# Patient Record
Sex: Female | Born: 1990 | Race: White | Hispanic: No | Marital: Single | Smoking: Current every day smoker
Health system: Southern US, Community
[De-identification: ages and names within clinical notes are randomized; demographics above are authoritative.]

## PROBLEM LIST (undated history)

## (undated) DIAGNOSIS — F909 Attention-deficit hyperactivity disorder, unspecified type: Secondary | ICD-10-CM

## (undated) DIAGNOSIS — T744XXA Shaken infant syndrome, initial encounter: Secondary | ICD-10-CM

## (undated) HISTORY — PX: BRAIN SURGERY: SHX531

---
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
## 2010-08-22 ENCOUNTER — Emergency Department (HOSPITAL_COMMUNITY): Admission: EM | Admit: 2010-08-22 | Discharge: 2010-08-22 | Payer: Self-pay | Admitting: Emergency Medicine

## 2010-09-14 ENCOUNTER — Emergency Department (HOSPITAL_COMMUNITY): Admission: EM | Admit: 2010-09-14 | Discharge: 2010-09-14 | Payer: Self-pay | Admitting: Emergency Medicine

## 2010-09-14 ENCOUNTER — Emergency Department (HOSPITAL_COMMUNITY): Admission: EM | Admit: 2010-09-14 | Discharge: 2010-09-15 | Payer: Self-pay | Admitting: Emergency Medicine

## 2010-11-01 ENCOUNTER — Emergency Department (HOSPITAL_COMMUNITY): Admission: EM | Admit: 2010-11-01 | Discharge: 2010-08-20 | Payer: Self-pay | Admitting: Emergency Medicine

## 2011-02-06 LAB — COMPREHENSIVE METABOLIC PANEL
AST: 22 U/L (ref 0–37)
Alkaline Phosphatase: 64 U/L (ref 39–117)
BUN: 9 mg/dL (ref 6–23)
CO2: 24 mEq/L (ref 19–32)
Chloride: 103 mEq/L (ref 96–112)
Creatinine, Ser: 0.73 mg/dL (ref 0.4–1.2)
GFR calc non Af Amer: >60 mL/min (ref >60)
Potassium: 3.7 mEq/L (ref 3.5–5.1)
Total Bilirubin: 0.6 mg/dL (ref 0.3–1.2)

## 2011-02-06 LAB — HEMOGLOBIN AND HEMATOCRIT, BLOOD: HCT: 32.5 % — ABNORMAL LOW (ref 36.0–46.0)

## 2011-02-06 LAB — DIFFERENTIAL
Basophils Absolute: 0 10*3/uL (ref 0.0–0.1)
Basophils Relative: 0 % (ref 0–1)
Eosinophils Absolute: 0 10*3/uL (ref 0.0–0.7)
Neutro Abs: 14.5 10*3/uL — ABNORMAL HIGH (ref 1.7–7.7)
Neutrophils Relative %: 86 % — ABNORMAL HIGH (ref 43–77)

## 2011-02-06 LAB — WET PREP, GENITAL: Trich, Wet Prep: NONE SEEN

## 2011-02-06 LAB — POCT URINALYSIS DIPSTICK
Ketones, ur: 15 mg/dL — AB
Nitrite: NEGATIVE
Protein, ur: NEGATIVE mg/dL
Urobilinogen, UA: 0.2 mg/dL (ref 0.0–1.0)

## 2011-02-06 LAB — CBC
MCHC: 33.6 g/dL (ref 30.0–36.0)
Platelets: 284 10*3/uL (ref 150–400)
RDW: 12.8 % (ref 11.5–15.5)

## 2011-02-06 LAB — GC/CHLAMYDIA PROBE AMP, GENITAL
Chlamydia, DNA Probe: NEGATIVE
GC Probe Amp, Genital: NEGATIVE

## 2011-02-06 LAB — POCT PREGNANCY, URINE: Preg Test, Ur: NEGATIVE

## 2011-02-06 LAB — URINE CULTURE: Culture  Setup Time: 201110212254

## 2011-02-07 LAB — CBC
HCT: 41.2 % (ref 36.0–46.0)
MCHC: 34.5 g/dL (ref 30.0–36.0)
Platelets: 242 10*3/uL (ref 150–400)
RDW: 12.8 % (ref 11.5–15.5)
WBC: 12.6 10*3/uL — ABNORMAL HIGH (ref 4.0–10.5)

## 2011-02-07 LAB — POCT PREGNANCY, URINE: Preg Test, Ur: NEGATIVE

## 2011-02-07 LAB — POCT I-STAT, CHEM 8
BUN: 10 mg/dL (ref 6–23)
BUN: 13 mg/dL (ref 6–23)
Calcium, Ion: 1.02 mmol/L — ABNORMAL LOW (ref 1.12–1.32)
Calcium, Ion: 1.07 mmol/L — ABNORMAL LOW (ref 1.12–1.32)
Chloride: 107 mEq/L (ref 96–112)
Creatinine, Ser: 0.5 mg/dL (ref 0.4–1.2)
HCT: 45 % (ref 36.0–46.0)
Hemoglobin: 15 g/dL (ref 12.0–15.0)
Potassium: 2.8 mEq/L — ABNORMAL LOW (ref 3.5–5.1)
Sodium: 137 mEq/L (ref 135–145)
Sodium: 139 mEq/L (ref 135–145)
TCO2: 19 mmol/L (ref 0–100)

## 2011-02-07 LAB — HEPATIC FUNCTION PANEL
Albumin: 4.2 g/dL (ref 3.5–5.2)
Alkaline Phosphatase: 61 U/L (ref 39–117)
Bilirubin, Direct: 0.1 mg/dL (ref 0.0–0.3)
Indirect Bilirubin: 0.5 mg/dL (ref 0.3–0.9)
Total Bilirubin: 0.6 mg/dL (ref 0.3–1.2)

## 2011-02-07 LAB — DIFFERENTIAL
Basophils Absolute: 0 10*3/uL (ref 0.0–0.1)
Basophils Relative: 0 % (ref 0–1)
Lymphocytes Relative: 12 % (ref 12–46)
Monocytes Absolute: 1.1 10*3/uL — ABNORMAL HIGH (ref 0.1–1.0)
Neutro Abs: 10 10*3/uL — ABNORMAL HIGH (ref 1.7–7.7)
Neutrophils Relative %: 80 % — ABNORMAL HIGH (ref 43–77)

## 2011-03-07 LAB — URINE MICROSCOPIC-ADD ON

## 2011-03-07 LAB — URINALYSIS, ROUTINE W REFLEX MICROSCOPIC
Bilirubin Urine: NEGATIVE
Glucose, UA: NEGATIVE mg/dL
Hgb urine dipstick: NEGATIVE
Ketones, ur: NEGATIVE mg/dL
Nitrite: NEGATIVE
Protein, ur: NEGATIVE mg/dL
Specific Gravity, Urine: 1.016 (ref 1.005–1.030)
Urobilinogen, UA: 0.2 mg/dL (ref 0.0–1.0)
pH: 6.5 (ref 5.0–8.0)

## 2011-03-07 LAB — PREGNANCY, URINE: Preg Test, Ur: NEGATIVE

## 2012-04-18 IMAGING — CT CT HEAD W/O CM
2 series · 16 of 30 positions shown, 20 images · non-contrast
Comparison: 02/04/2009.

CLINICAL DATA: Migraine headache.  Nausea and vomiting.

CT HEAD WITHOUT CONTRAST
TECHNIQUE: Contiguous axial images were obtained from the base of
the skull through the vertex without contrast.

[Series 3: head w/o · axial · non-contrast · 0.50mm/px · z∈[+8,+138]mm · 13 of 32 slices shown, 17 images]
[im 3/32  brain]
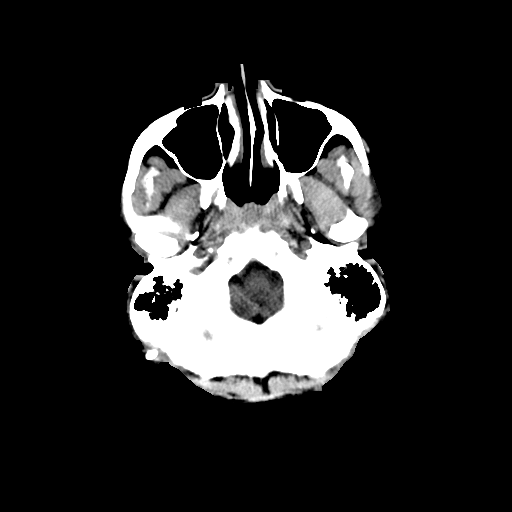
[im 3/32  bone]
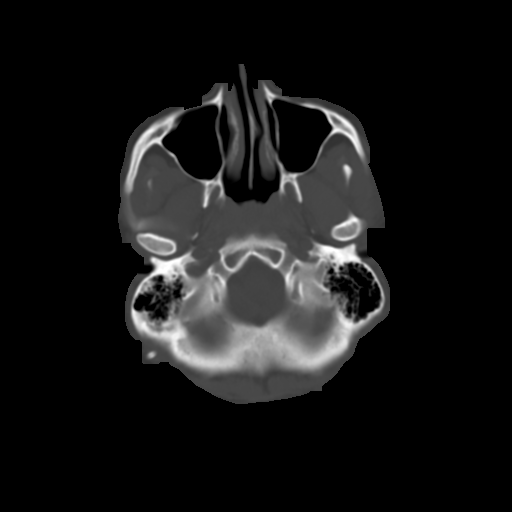
[im 5/32  brain]
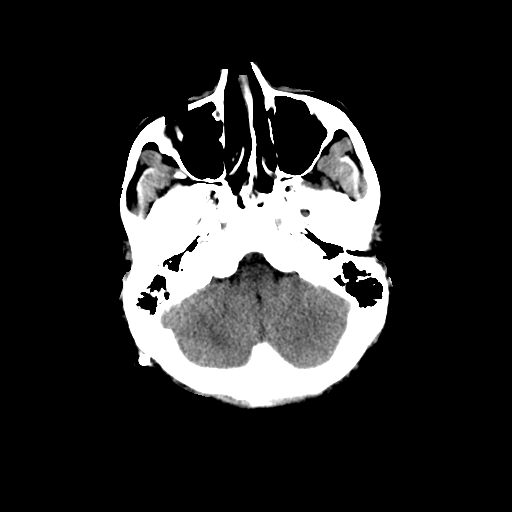
[im 7/32  brain]
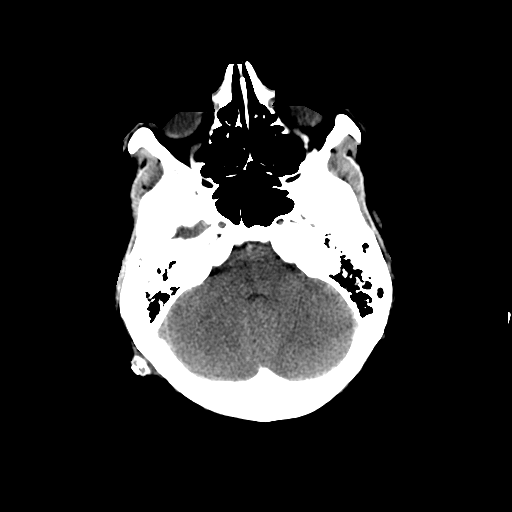
[im 9/32  brain]
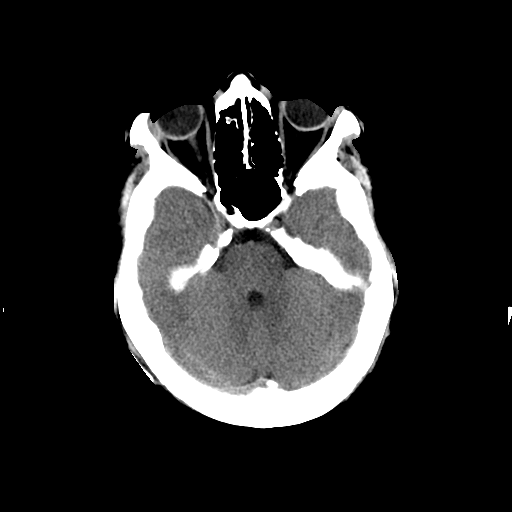
[im 12/32  brain]
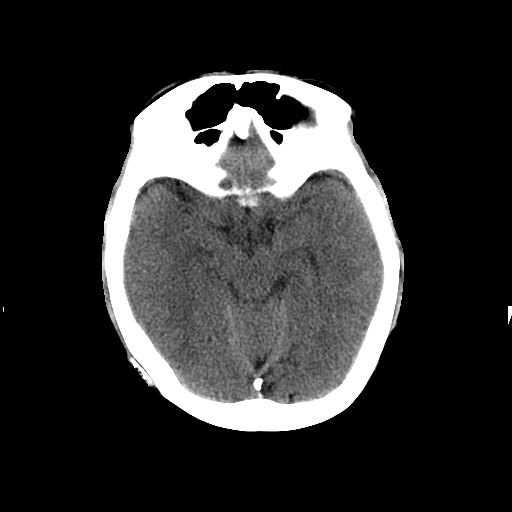
[im 12/32  bone]
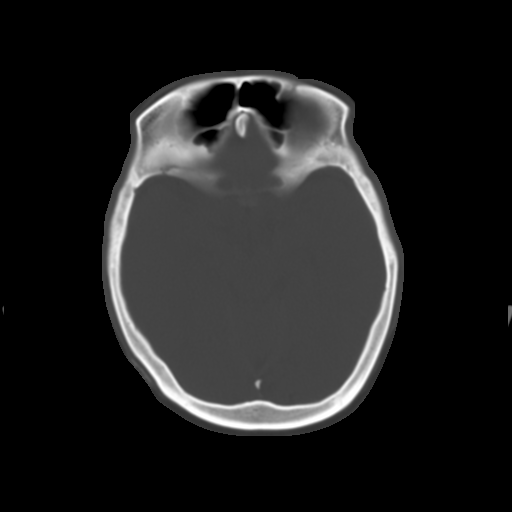
[im 14/32  brain]
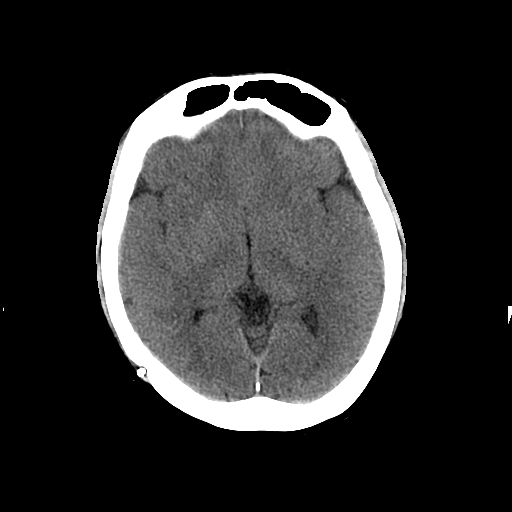
[im 16/32  brain]
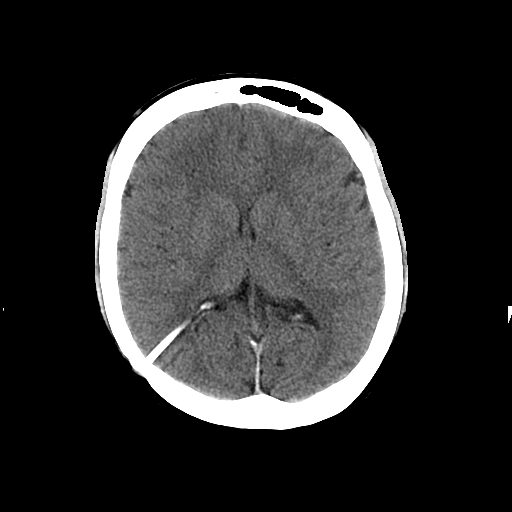
[im 18/32  brain]
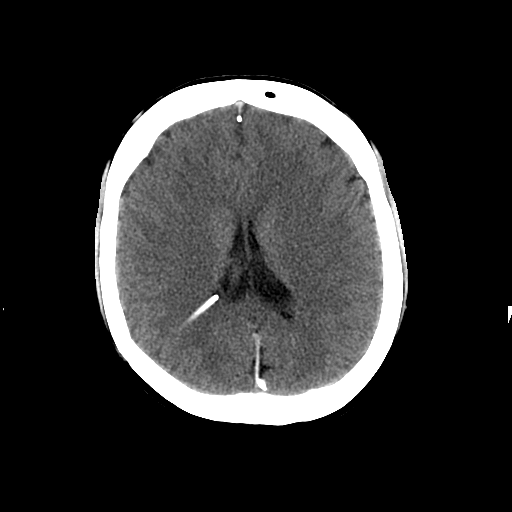
[im 20/32  brain]
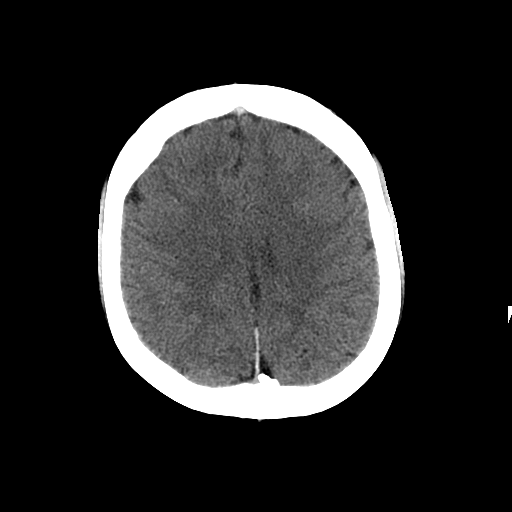
[im 20/32  bone]
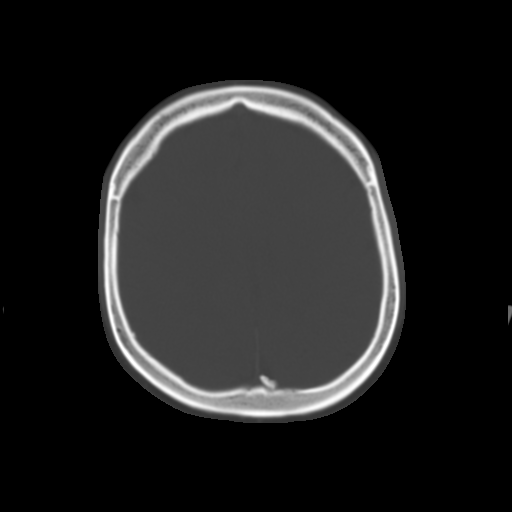
[im 23/32  brain]
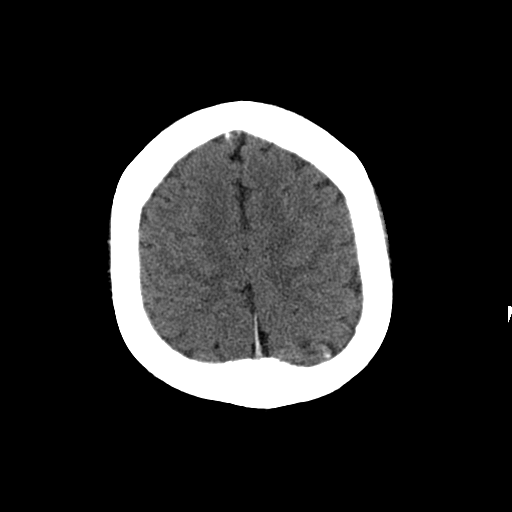
[im 25/32  brain]
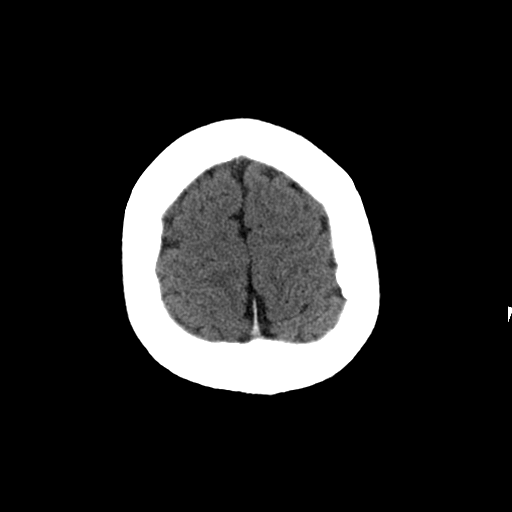
[im 27/32  brain]
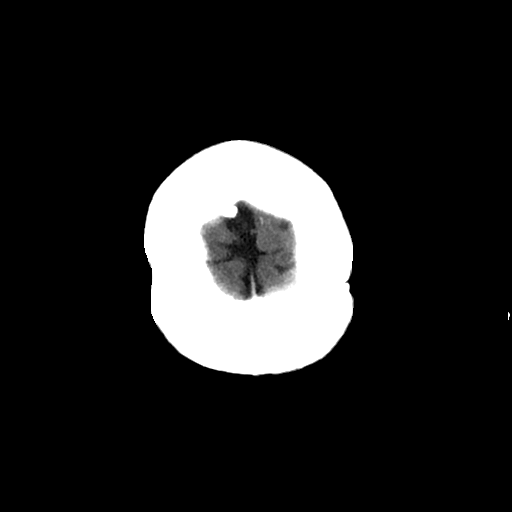
[im 29/32  brain]
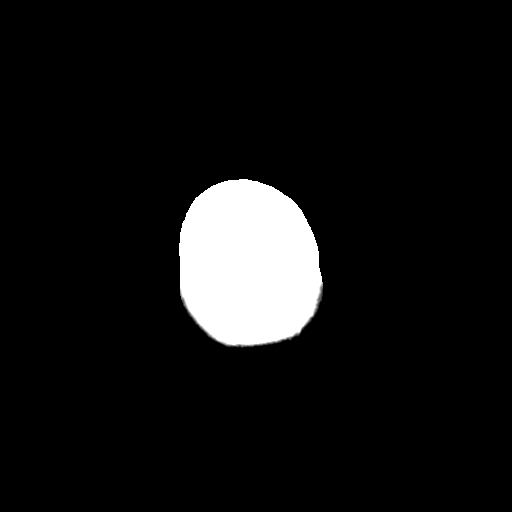
[im 29/32  bone]
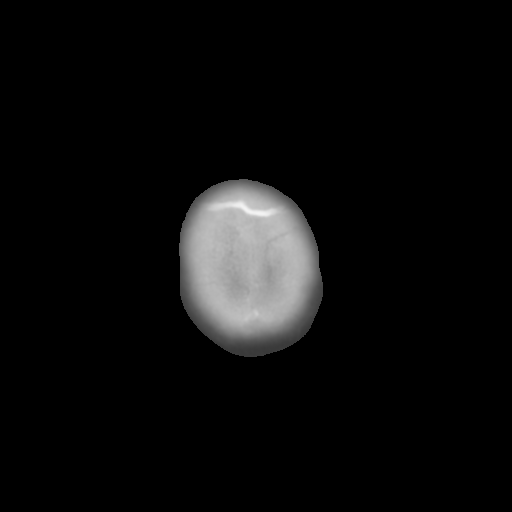

[Series 4: head w/o bone · axial · non-contrast · 0.50mm/px · z∈[+8,+53]mm · 3 of 32 slices shown]
[im 3/32  bone]
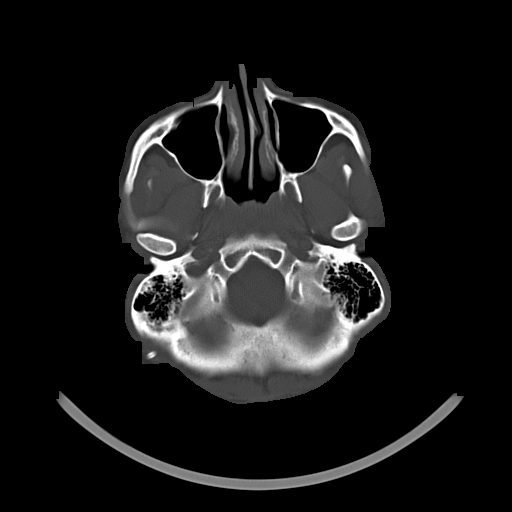
[im 7/32  bone]
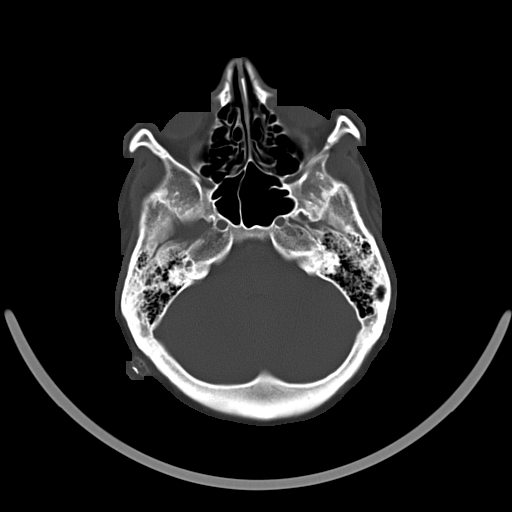
[im 12/32  bone]
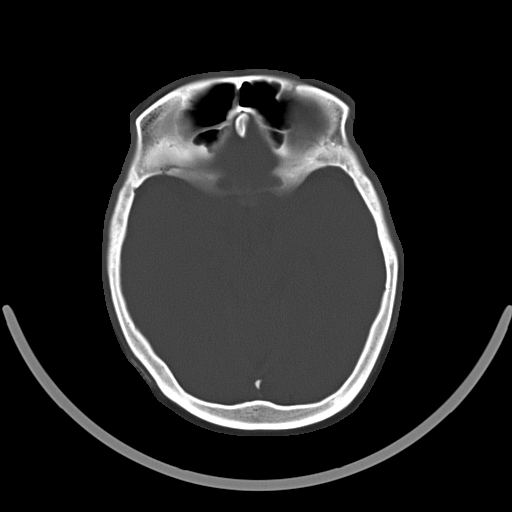

[16 of 30 positions shown; findings below may reference images not displayed]

FINDINGS: No mass lesion, mass effect, midline shift,
hydrocephalus, hemorrhage.  No territorial ischemia or acute
infarction. Burr hole is present in the right parietal scalp.
There is also a right parietal ventriculostomy terminating in the
atrium of the right lateral ventricle.  Paranasal sinuses appear
within normal limits.  Ventricular size and configuration is
unchanged compared to prior exam of 2747.
IMPRESSION: 1.  No acute abnormality.
2.  Right parietal shunt appears unchanged.  No hydrocephalus or
change in size/configuration of the ventricles.
3.  Stable head CT.

## 2014-01-03 ENCOUNTER — Encounter (HOSPITAL_COMMUNITY): Payer: Self-pay | Admitting: Emergency Medicine

## 2014-01-03 ENCOUNTER — Emergency Department (HOSPITAL_COMMUNITY)
Admission: EM | Admit: 2014-01-03 | Discharge: 2014-01-03 | Disposition: A | Discharge status: Home / Self Care | Payer: Self-pay | Attending: Emergency Medicine | Admitting: Emergency Medicine

## 2014-01-03 DIAGNOSIS — F17200 Nicotine dependence, unspecified, uncomplicated: Secondary | ICD-10-CM | POA: Insufficient documentation

## 2014-01-03 DIAGNOSIS — N390 Urinary tract infection, site not specified: Secondary | ICD-10-CM | POA: Insufficient documentation

## 2014-01-03 DIAGNOSIS — Z79899 Other long term (current) drug therapy: Secondary | ICD-10-CM | POA: Insufficient documentation

## 2014-01-03 DIAGNOSIS — F909 Attention-deficit hyperactivity disorder, unspecified type: Secondary | ICD-10-CM | POA: Insufficient documentation

## 2014-01-03 DIAGNOSIS — Z3202 Encounter for pregnancy test, result negative: Secondary | ICD-10-CM | POA: Insufficient documentation

## 2014-01-03 HISTORY — DX: Shaken infant syndrome, initial encounter: T74.4XXA

## 2014-01-03 HISTORY — DX: Attention-deficit hyperactivity disorder, unspecified type: F90.9

## 2014-01-03 LAB — URINALYSIS, ROUTINE W REFLEX MICROSCOPIC
Bilirubin Urine: NEGATIVE
Glucose, UA: NEGATIVE mg/dL
LEUKOCYTES UA: NEGATIVE
NITRITE: NEGATIVE
PH: 6 (ref 5.0–8.0)
Protein, ur: NEGATIVE mg/dL
Specific Gravity, Urine: 1.015 (ref 1.005–1.030)
UROBILINOGEN UA: 0.2 mg/dL (ref 0.0–1.0)

## 2014-01-03 LAB — URINE MICROSCOPIC-ADD ON

## 2014-01-03 LAB — POCT PREGNANCY, URINE: Preg Test, Ur: NEGATIVE

## 2014-01-03 MED ORDER — HYDROCODONE-ACETAMINOPHEN 5-325 MG PO TABS: ORAL_TABLET | ORAL | Status: AC

## 2014-01-03 MED ORDER — CEPHALEXIN 500 MG PO CAPS: 500.0000 mg | ORAL_CAPSULE | Freq: Four times a day (QID) | ORAL | Status: AC

## 2014-01-03 MED ORDER — CEPHALEXIN 500 MG PO CAPS
500.0000 mg | ORAL_CAPSULE | Freq: Once | ORAL | Status: AC
  Administered 2014-01-03: 500 mg via ORAL
  Filled 2014-01-03: qty 1

## 2014-01-03 MED ORDER — HYDROCODONE-ACETAMINOPHEN 5-325 MG PO TABS
1.0000 | ORAL_TABLET | Freq: Once | ORAL | Status: AC
  Administered 2014-01-03: 1 via ORAL
  Filled 2014-01-03: qty 1

## 2014-01-03 NOTE — Discharge Instructions (Signed)
Urinary Tract Infection °A urinary tract infection (UTI) can occur any place along the urinary tract. The tract includes the kidneys, ureters, bladder, and urethra. A type of germ called bacteria often causes a UTI. UTIs are often helped with antibiotic medicine.  °HOME CARE  °· If given, take antibiotics as told by your doctor. Finish them even if you start to feel better. °· Drink enough fluids to keep your pee (urine) clear or pale yellow. °· Avoid tea, drinks with caffeine, and bubbly (carbonated) drinks. °· Pee often. Avoid holding your pee in for a long time. °· Pee before and after having sex (intercourse). °· Wipe from front to back after you poop (bowel movement) if you are a woman. Use each tissue only once. °GET HELP RIGHT AWAY IF:  °· You have back pain. °· You have lower belly (abdominal) pain. °· You have chills. °· You feel sick to your stomach (nauseous). °· You throw up (vomit). °· Your burning or discomfort with peeing does not go away. °· You have a fever. °· Your symptoms are not better in 3 days. °MAKE SURE YOU:  °· Understand these instructions. °· Will watch your condition. °· Will get help right away if you are not doing well or get worse. °Document Released: 04/29/2008 Document Revised: 08/05/2012 Document Reviewed: 06/11/2012 °[employer]® Patient Information ©2014 [employer]. ° °

## 2014-01-03 NOTE — ED Notes (Addendum)
Alert, pain in back and dysuria , frequency of urination and voiding sm amts.    [place] and began having low backpain and UTI sx.  No NVD. No fever or
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
## 2014-01-03 NOTE — ED Notes (Signed)
Patient c/o lower back pain with burning during urination that started last night. Per patient when she urinates she gets "sharp pains in back."

## 2014-01-04 LAB — URINE CULTURE
COLONY COUNT: NO GROWTH
CULTURE: NO GROWTH

## 2014-01-05 NOTE — ED Provider Notes (Signed)
Medical screening examination/treatment/procedure(s) were performed by non-physician practitioner and as supervising physician I was immediately available for consultation/collaboration.  EKG Interpretation   None         Vallory Oetken M Anndrea Mihelich, DO 01/05/14 0815 

## 2014-09-26 ENCOUNTER — Encounter (HOSPITAL_COMMUNITY): Payer: Self-pay | Admitting: Emergency Medicine

## 2017-10-28 ENCOUNTER — Emergency Department (HOSPITAL_COMMUNITY): Payer: Self-pay

## 2017-10-28 ENCOUNTER — Emergency Department (HOSPITAL_COMMUNITY)
Admission: EM | Admit: 2017-10-28 | Discharge: 2017-10-29 | Disposition: A | Discharge status: Home / Self Care | Payer: Self-pay | Attending: Emergency Medicine | Admitting: Emergency Medicine

## 2017-10-28 DIAGNOSIS — F17210 Nicotine dependence, cigarettes, uncomplicated: Secondary | ICD-10-CM | POA: Insufficient documentation

## 2017-10-28 DIAGNOSIS — F1910 Other psychoactive substance abuse, uncomplicated: Secondary | ICD-10-CM

## 2017-10-28 DIAGNOSIS — T50901A Poisoning by unspecified drugs, medicaments and biological substances, accidental (unintentional), initial encounter: Secondary | ICD-10-CM

## 2017-10-28 DIAGNOSIS — Z79899 Other long term (current) drug therapy: Secondary | ICD-10-CM | POA: Insufficient documentation

## 2017-10-28 DIAGNOSIS — T401X1A Poisoning by heroin, accidental (unintentional), initial encounter: Secondary | ICD-10-CM | POA: Insufficient documentation

## 2017-10-28 DIAGNOSIS — F1410 Cocaine abuse, uncomplicated: Secondary | ICD-10-CM | POA: Insufficient documentation

## 2017-10-28 LAB — SALICYLATE LEVEL

## 2017-10-28 LAB — CK: CK TOTAL: 122 U/L (ref 38–234)

## 2017-10-28 LAB — RAPID URINE DRUG SCREEN, HOSP PERFORMED
Amphetamines: NOT DETECTED
BARBITURATES: NOT DETECTED
Benzodiazepines: POSITIVE — AB
COCAINE: POSITIVE — AB
Opiates: POSITIVE — AB
Tetrahydrocannabinol: NOT DETECTED

## 2017-10-28 LAB — CBC WITH DIFFERENTIAL/PLATELET
Basophils Absolute: 0 10*3/uL (ref 0.0–0.1)
Basophils Relative: 0 %
Eosinophils Absolute: 0.1 10*3/uL (ref 0.0–0.7)
Eosinophils Relative: 1 %
HEMATOCRIT: 36.9 % (ref 36.0–46.0)
Hemoglobin: 12.2 g/dL (ref 12.0–15.0)
LYMPHS ABS: 2.4 10*3/uL (ref 0.7–4.0)
LYMPHS PCT: 33 %
MCH: 29.5 pg (ref 26.0–34.0)
MCHC: 33.1 g/dL (ref 30.0–36.0)
MCV: 89.3 fL (ref 78.0–100.0)
MONOS PCT: 7 %
Monocytes Absolute: 0.5 10*3/uL (ref 0.1–1.0)
NEUTROS ABS: 4.3 10*3/uL (ref 1.7–7.7)
Neutrophils Relative %: 59 %
Platelets: 241 10*3/uL (ref 150–400)
RBC: 4.13 MIL/uL (ref 3.87–5.11)
RDW: 12.7 % (ref 11.5–15.5)
WBC: 7.3 10*3/uL (ref 4.0–10.5)

## 2017-10-28 LAB — COMPREHENSIVE METABOLIC PANEL
ALT: 18 U/L (ref 14–54)
AST: 26 U/L (ref 15–41)
Albumin: 3.6 g/dL (ref 3.5–5.0)
Alkaline Phosphatase: 49 U/L (ref 38–126)
Anion gap: 7 (ref 5–15)
BUN: 12 mg/dL (ref 6–20)
CO2: 28 mmol/L (ref 22–32)
Calcium: 8.8 mg/dL — ABNORMAL LOW (ref 8.9–10.3)
Chloride: 105 mmol/L (ref 101–111)
Creatinine, Ser: 0.67 mg/dL (ref 0.44–1.00)
GFR calc Af Amer: >60 mL/min (ref >60)
GFR calc non Af Amer: >60 mL/min (ref >60)
Glucose, Bld: 80 mg/dL (ref 65–99)
POTASSIUM: 3.4 mmol/L — AB (ref 3.5–5.1)
SODIUM: 140 mmol/L (ref 135–145)
Total Bilirubin: 0.2 mg/dL — ABNORMAL LOW (ref 0.3–1.2)
Total Protein: 6.5 g/dL (ref 6.5–8.1)

## 2017-10-28 LAB — ACETAMINOPHEN LEVEL: Acetaminophen (Tylenol), Serum: <10 ug/mL — ABNORMAL LOW (ref 10–30)

## 2017-10-28 LAB — I-STAT BETA HCG BLOOD, ED (MC, WL, AP ONLY): I-stat hCG, quantitative: <5 m[IU]/mL (ref <5)

## 2017-10-28 LAB — CBG MONITORING, ED: Glucose-Capillary: 65 mg/dL (ref 65–99)

## 2017-10-28 LAB — ETHANOL: Alcohol, Ethyl (B): <10 mg/dL (ref <10)

## 2017-10-28 MED ORDER — NALOXONE HCL 2 MG/2ML IJ SOSY
PREFILLED_SYRINGE | INTRAMUSCULAR | Status: AC
  Filled 2017-10-28: qty 2

## 2017-10-28 MED ORDER — POTASSIUM CHLORIDE CRYS ER 20 MEQ PO TBCR
40.0000 meq | EXTENDED_RELEASE_TABLET | Freq: Once | ORAL | Status: AC
  Administered 2017-10-28: 40 meq via ORAL
  Filled 2017-10-28: qty 2
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
## 2017-10-28 NOTE — ED Notes (Signed)
Pt used the bedside commode without incident.

## 2017-10-28 NOTE — ED Triage Notes (Signed)
Per EMS pt took possibly Heroin and Marsh DollyMarianna about 1 hr ago

## 2017-10-29 NOTE — Discharge Instructions (Signed)
Call any of the numbers on the resource guides to get help with your drug problem

## 2017-10-29 NOTE — ED Notes (Signed)
Pt has no family contact number on file. Pt to stay until morning for Obs, hopefully mother or other will call pt will more than likely need a ride home.

## 2017-10-29 NOTE — ED Notes (Signed)
Pt walked around nurses station without incident.  Pt called grandmother to call mother to come get her.

## 2017-10-29 NOTE — ED Provider Notes (Addendum)
03:00 AM pt left at change of shift.  The patient overdosed tonight on heroin with a very positive UDS.  Nursing staff was unable to contact her mother who the patient lives with.  Her mother did not leave a contact phone number and the patient does not know her mother's phone number.  PT is sleeping, easily awakened, states she accidentally overdosed. Falls back to sleep quickly.   6:30 AM patient sleeping however she is very easily awakened.  She seems much more awake than earlier.  She states she feels fine except she still feels sleepy.  I am going to have nursing staff get her up and ambulate and if she wants to eat or drink she can.  She will do need to work on getting a way back home.  Nurses report patient ambulated well without difficulty.  She is trying to find a ride home.  Devoria AlbeIva Aquarius Latouche, MD, Concha PyoFACEP      Hakiem Malizia, MD 10/29/17 [phone]    Devoria AlbeKnapp, Karma Hiney, MD 10/29/17 [phone]

## 2021-08-23 ENCOUNTER — Emergency Department (HOSPITAL_COMMUNITY): Payer: Self-pay

## 2021-08-23 ENCOUNTER — Encounter (HOSPITAL_COMMUNITY): Payer: Self-pay | Admitting: Emergency Medicine

## 2021-08-23 ENCOUNTER — Emergency Department (HOSPITAL_COMMUNITY)
Admission: EM | Admit: 2021-08-23 | Discharge: 2021-08-24 | Disposition: A | Discharge status: Home / Self Care | Payer: Self-pay | Attending: Student | Admitting: Student

## 2021-08-23 DIAGNOSIS — M549 Dorsalgia, unspecified: Secondary | ICD-10-CM

## 2021-08-23 DIAGNOSIS — F17210 Nicotine dependence, cigarettes, uncomplicated: Secondary | ICD-10-CM | POA: Insufficient documentation

## 2021-08-23 DIAGNOSIS — N7091 Salpingitis, unspecified: Secondary | ICD-10-CM | POA: Insufficient documentation

## 2021-08-23 DIAGNOSIS — M545 Low back pain, unspecified: Secondary | ICD-10-CM | POA: Insufficient documentation

## 2021-08-23 DIAGNOSIS — N7093 Salpingitis and oophoritis, unspecified: Secondary | ICD-10-CM

## 2021-08-23 LAB — CBC WITH DIFFERENTIAL/PLATELET
Abs Immature Granulocytes: 0.06 10*3/uL (ref 0.00–0.07)
Basophils Absolute: 0 10*3/uL (ref 0.0–0.1)
Basophils Relative: 0 %
Eosinophils Absolute: 0 10*3/uL (ref 0.0–0.5)
Eosinophils Relative: 0 %
HCT: 34.7 % — ABNORMAL LOW (ref 36.0–46.0)
Hemoglobin: 10.8 g/dL — ABNORMAL LOW (ref 12.0–15.0)
Immature Granulocytes: 1 %
Lymphocytes Relative: 13 %
Lymphs Abs: 1.4 10*3/uL (ref 0.7–4.0)
MCH: 25.8 pg — ABNORMAL LOW (ref 26.0–34.0)
MCHC: 31.1 g/dL (ref 30.0–36.0)
MCV: 83 fL (ref 80.0–100.0)
Monocytes Absolute: 0.7 10*3/uL (ref 0.1–1.0)
Monocytes Relative: 6 %
Neutro Abs: 8.5 10*3/uL — ABNORMAL HIGH (ref 1.7–7.7)
Neutrophils Relative %: 80 %
Platelets: 246 10*3/uL (ref 150–400)
RBC: 4.18 MIL/uL (ref 3.87–5.11)
RDW: 15.9 % — ABNORMAL HIGH (ref 11.5–15.5)
WBC: 10.7 10*3/uL — ABNORMAL HIGH (ref 4.0–10.5)
nRBC: 0 % (ref 0.0–0.2)

## 2021-08-23 LAB — URINALYSIS, ROUTINE W REFLEX MICROSCOPIC
Bilirubin Urine: NEGATIVE
Glucose, UA: NEGATIVE mg/dL
Ketones, ur: NEGATIVE mg/dL
Nitrite: POSITIVE — AB
Protein, ur: 100 mg/dL — AB
Specific Gravity, Urine: 1.025 (ref 1.005–1.030)
WBC, UA: >50 WBC/hpf — ABNORMAL HIGH (ref 0–5)
pH: 5 (ref 5.0–8.0)

## 2021-08-23 LAB — BASIC METABOLIC PANEL
Anion gap: 8 (ref 5–15)
BUN: 11 mg/dL (ref 6–20)
CO2: 25 mmol/L (ref 22–32)
Calcium: 8.4 mg/dL — ABNORMAL LOW (ref 8.9–10.3)
Chloride: 104 mmol/L (ref 98–111)
Creatinine, Ser: 0.56 mg/dL (ref 0.44–1.00)
GFR, Estimated: >60 mL/min (ref >60)
Glucose, Bld: 91 mg/dL (ref 70–99)
Potassium: 3.7 mmol/L (ref 3.5–5.1)
Sodium: 137 mmol/L (ref 135–145)

## 2021-08-23 LAB — PREGNANCY, URINE: Preg Test, Ur: NEGATIVE

## 2021-08-23 MED ORDER — IOHEXOL 300 MG/ML  SOLN
100.0000 mL | Freq: Once | INTRAMUSCULAR | Status: AC | PRN
  Administered 2021-08-23: 100 mL via INTRAVENOUS

## 2021-08-23 MED ORDER — HYDROMORPHONE HCL 1 MG/ML IJ SOLN
1.0000 mg | Freq: Once | INTRAMUSCULAR | Status: AC
  Administered 2021-08-23: 1 mg via INTRAMUSCULAR
  Filled 2021-08-23: qty 1

## 2021-08-23 MED ORDER — FENTANYL CITRATE PF 50 MCG/ML IJ SOSY
50.0000 ug | PREFILLED_SYRINGE | Freq: Once | INTRAMUSCULAR | Status: AC
  Administered 2021-08-23: 50 ug via INTRAMUSCULAR
  Filled 2021-08-23: qty 1

## 2021-08-23 NOTE — ED Notes (Signed)
Pt in CT, IV infiltrated, new IV placed by this RN
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
## 2021-08-23 NOTE — ED Triage Notes (Signed)
Pt from home via [facility]. Pt reports right sided back pain that starts at the buttock and radiates down behind her right knee. Pt reports this started 2 days after picking up a stove.

## 2021-08-23 NOTE — ED Notes (Signed)
Pt here with reports of right back pain x 2days, possibly relating to lifting heavy appliance. Pt is crying in pain, and writhing.

## 2021-08-23 NOTE — ED Notes (Signed)
Pt was sleeping, pt voided while sleeping in her adult diaper. Pt reports improvement in pain but still asking for medicine to help her rest.

## 2021-08-24 MED ORDER — CEFADROXIL 500 MG PO CAPS: 500.0000 mg | ORAL_CAPSULE | Freq: Two times a day (BID) | ORAL | 0 refills | Status: AC

## 2021-08-24 MED ORDER — CEFTRIAXONE SODIUM 1 G IJ SOLR: 1.0000 g | Freq: Once | INTRAMUSCULAR | Status: DC

## 2021-08-24 MED ORDER — SODIUM CHLORIDE 0.9 % IV SOLN
1.0000 g | Freq: Once | INTRAVENOUS | Status: AC
  Administered 2021-08-24: 1 g via INTRAVENOUS
  Filled 2021-08-24: qty 10

## 2021-08-24 MED ORDER — DOXYCYCLINE HYCLATE 100 MG PO CAPS: 100.0000 mg | ORAL_CAPSULE | Freq: Two times a day (BID) | ORAL | 0 refills | Status: AC

## 2021-08-24 MED ORDER — OXYCODONE-ACETAMINOPHEN 5-325 MG PO TABS: 1.0000 | ORAL_TABLET | Freq: Three times a day (TID) | ORAL | 0 refills | Status: AC | PRN

## 2021-08-24 MED ORDER — CYCLOBENZAPRINE HCL 10 MG PO TABS: 10.0000 mg | ORAL_TABLET | Freq: Two times a day (BID) | ORAL | 0 refills | Status: AC | PRN

## 2021-08-24 MED ORDER — HYDROMORPHONE HCL 1 MG/ML IJ SOLN
0.2500 mg | Freq: Once | INTRAMUSCULAR | Status: AC
Start: 2021-08-24 — End: 2021-08-24
  Administered 2021-08-24: 0.25 mg via INTRAVENOUS
  Filled 2021-08-24: qty 1

## 2021-08-24 NOTE — Discharge Instructions (Addendum)
Back pain-likely this is a muscular strain, recommend over-the-counter pain medications,  heating the area,  stretching out this help relieve your pain.  I was given you a short course of narcotics please take as prescribed.I have given you a short course of narcotics please take as prescribed.  This medication can make you drowsy do not consume alcohol or operate heavy machinery when taking this medication.  This medication is Tylenol in it do not take Tylenol and take this medication.  I have also given you a prescription for a muscle relaxer this can make you drowsy do not consume alcohol or operate heavy machinery when taking this medication.    Pelvic infection-I have started you on antibiotics doxycycline as well as Duricef please take them as prescribed.  It is extremely important that you follow-up with OB/GYN in one week for reevaluation you [facility] [place] or go to the women's center in [place].  Come back to the emergency department if you develop chest pain, shortness of breath, severe abdominal pain, uncontrolled nausea, vomiting, diarrhea.

## 2021-08-26 LAB — URINE CULTURE: Culture: >=100000 — AB

## 2021-08-27 ENCOUNTER — Telehealth: Payer: Self-pay

## 2021-08-27 LAB — GC/CHLAMYDIA PROBE AMP (~~LOC~~) NOT AT ARMC
Chlamydia: NEGATIVE
Comment: NEGATIVE
Comment: NORMAL
Neisseria Gonorrhea: NEGATIVE

## 2021-08-27 NOTE — Telephone Encounter (Signed)
Post ED Visit - Positive Culture Follow-up  Culture report reviewed by antimicrobial stewardship pharmacist: [facility] [x]  , Pharm.D. []  Loleta Dicker, Pharm.D., BCPS AQ-ID []  , Pharm.D., BCPS []  Celedonio Miyamoto, Pharm.D., BCPS []  [place], Garvin Fila.D., BCPS, AAHIVP []  , Pharm.D., BCPS, AAHIVP []  Georgina Pillion, PharmD, BCPS []  , PharmD, BCPS []  [place], PharmD, BCPS []  [address], PharmD []  , PharmD, BCPS []  Estella Husk, PharmD  Pharmacy Team []  Lysle Pearl, PharmD []  , PharmD []  Phillips Climes, PharmD []  , Rph []  Agapito Games) , PharmD []  Verlan Friends, PharmD []  , PharmD []  Mervyn Gay, PharmD []  , PharmD []  Vinnie Level, PharmD []  [facility], PharmD []  , PharmD []  Len Childs, PharmD   Positive urine culture Treated with Cefadroxil and Doxycycline Hyclate, organism sensitive to the same and no further patient follow-up is required at this time.  08/27/2021, 11:37 AM

## 2022-10-30 ENCOUNTER — Ambulatory Visit: Payer: Self-pay | Admitting: *Deleted

## 2022-10-30 NOTE — Telephone Encounter (Signed)
Reason for Disposition  [1] Stool is mucus or pus AND [2] persists > 24 hours  Answer Assessment - Initial Assessment Questions 1. COLOR: "What color is it?" "Is that color in part or all of the stool?"     Stool normal color- yellow mucus on stool 2. ONSET: "When was the unusual color first noted?"     today 3. CAUSE: "Have you eaten any food or taken any medicine of this color?" Note: See listing in Background Information section.      Patient did have salad last night 4. OTHER SYMPTOMS: "Do you have any other symptoms?" (e.g., abdomen pain, diarrhea, jaundice, fever).     Constipation, hemorrhoid hx Patient states she did have bout of diarrhea 1 week ago- took laxitive  Protocols used: Stools - Unusual Color-A-AH

## 2022-10-30 NOTE — Telephone Encounter (Signed)
  Chief Complaint: mucus in stool Symptoms: yellow/white mucus on stool Frequency: today Pertinent Negatives: Patient denies blood, pain Disposition: [] ED /[] Urgent Care (no appt availability in office) / [] Appointment(In office/virtual)/ []  [place] [facility]/ [] Home Care/ [] Refused Recommended Disposition /[x] [place] [facility]/ []  Follow-up with PCP Additional Notes: Patient does not have PCP- advised mobile unit for assistance and evaluation

## 2023-04-22 IMAGING — CT CT RENAL STONE PROTOCOL
2 of 4 series · 16 of 46 positions shown, 18 images · non-contrast
Comparison: 09/15/2010

CLINICAL DATA: Right flank pain and back pain

EXAM:
CT ABDOMEN AND PELVIS WITHOUT CONTRAST
TECHNIQUE: Multidetector CT imaging of the abdomen and pelvis was performed
following the standard protocol without IV contrast.

[Series 2: axial st · axial · 0.66mm/px · z∈[-836,-410]mm · 13 of 96 slices shown, 15 images]
[im 6/96  soft-tissue]
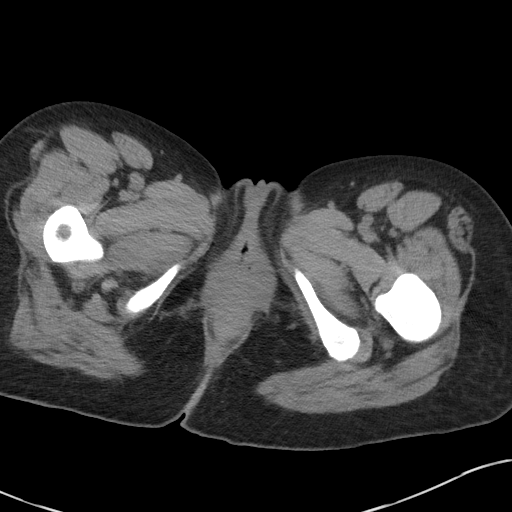
[im 6/96  bone]
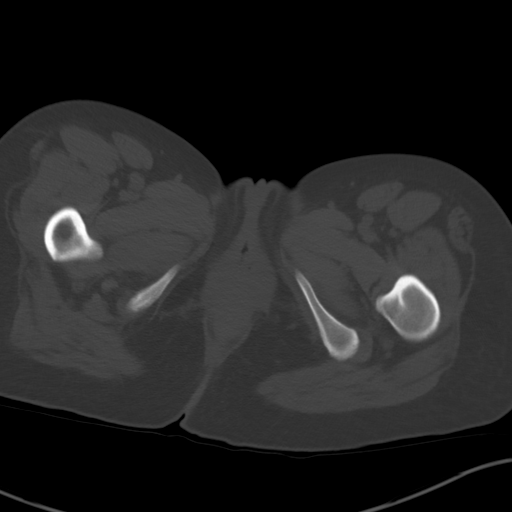
[im 16/96  soft-tissue]
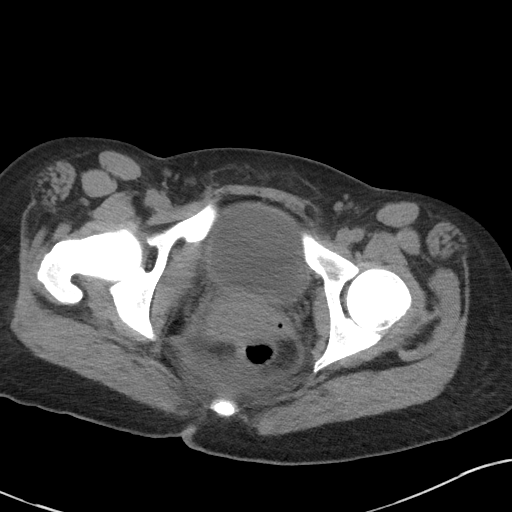
[im 21/96  soft-tissue]
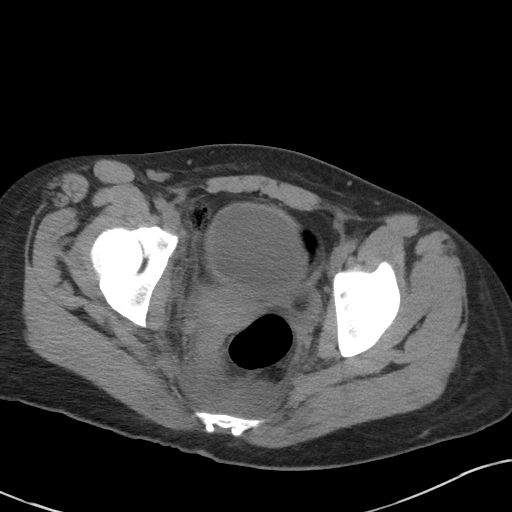
[im 26/96  soft-tissue]
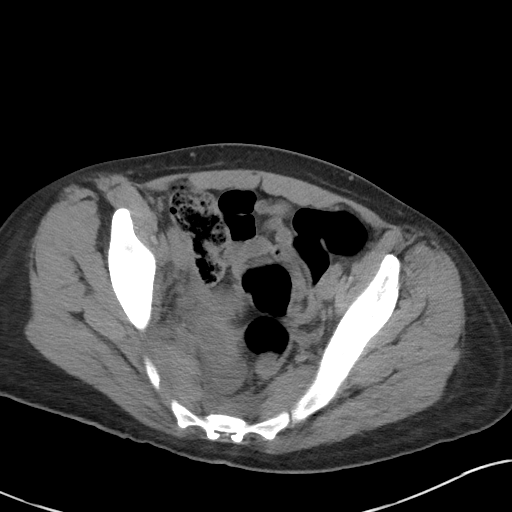
[im 36/96  soft-tissue]
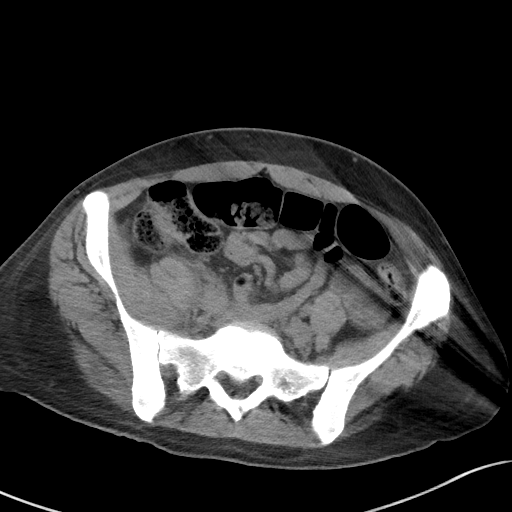
[im 41/96  soft-tissue]
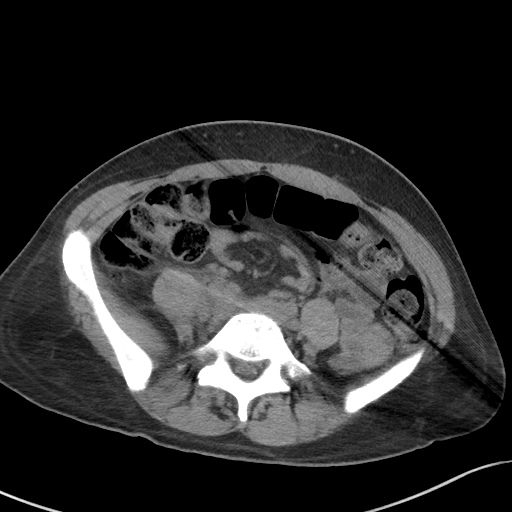
[im 51/96  soft-tissue]
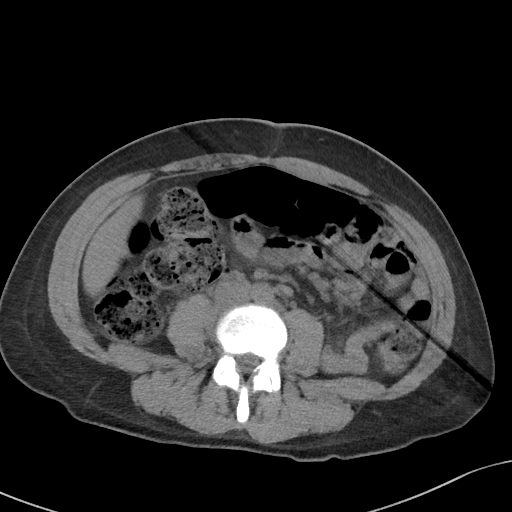
[im 56/96  soft-tissue]
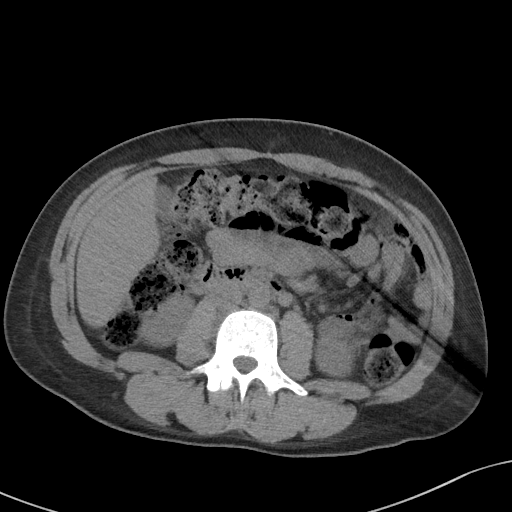
[im 61/96  soft-tissue]
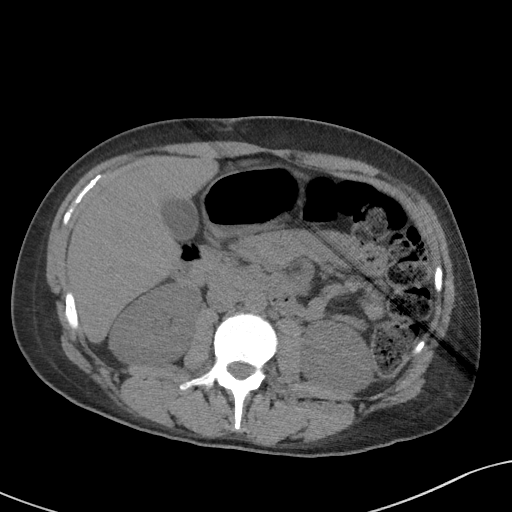
[im 61/96  bone]
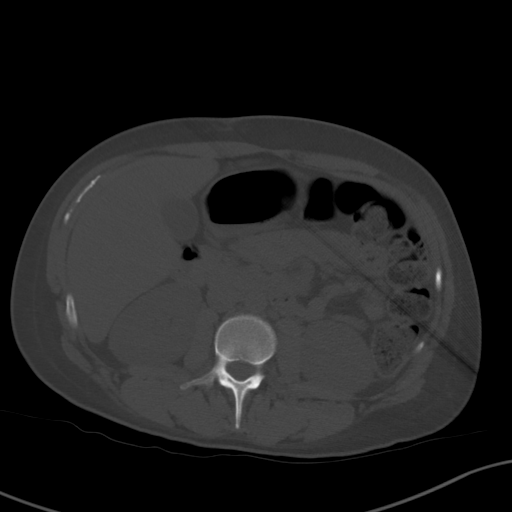
[im 71/96  soft-tissue]
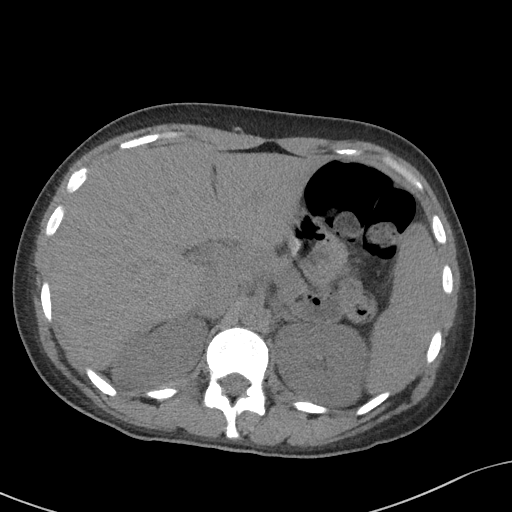
[im 76/96  soft-tissue]
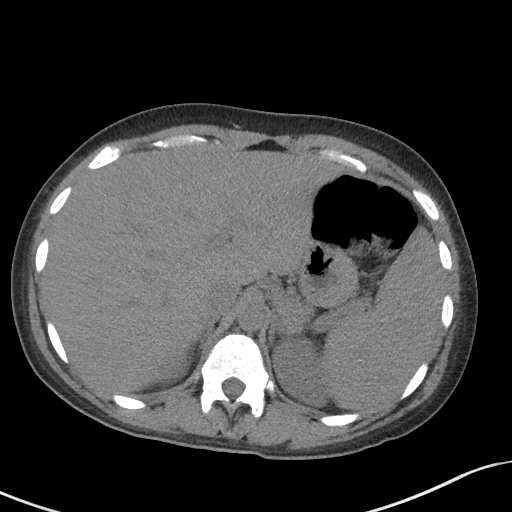
[im 81/96  soft-tissue]
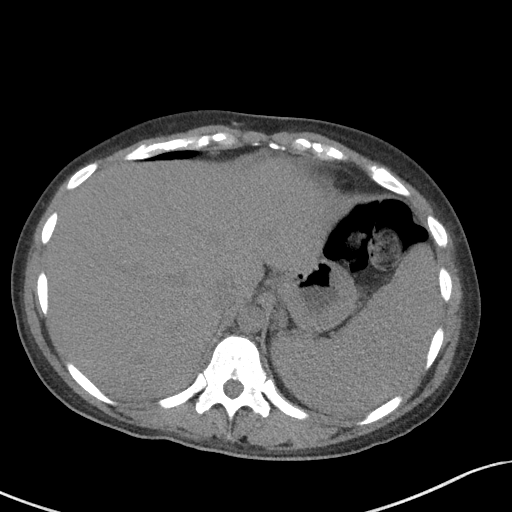
[im 91/96  soft-tissue]
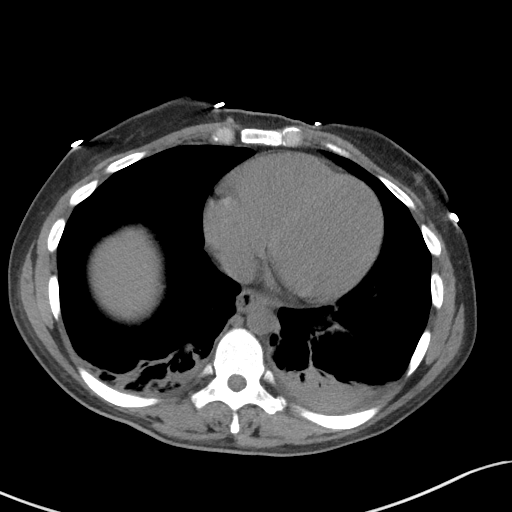

[Series 5: coronal st · coronal · 0.78mm/px · 3 of 76 slices shown]
[im 26/76  soft-tissue]
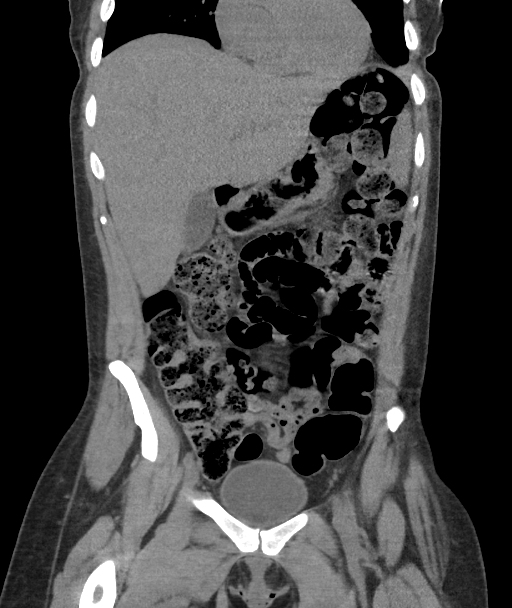
[im 34/76  soft-tissue]
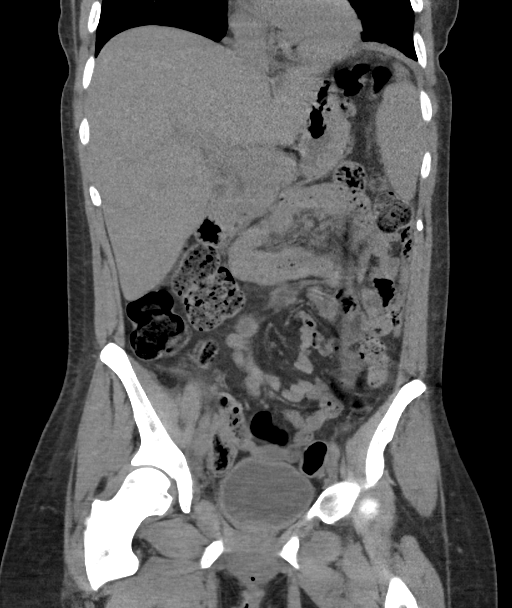
[im 42/76  soft-tissue]
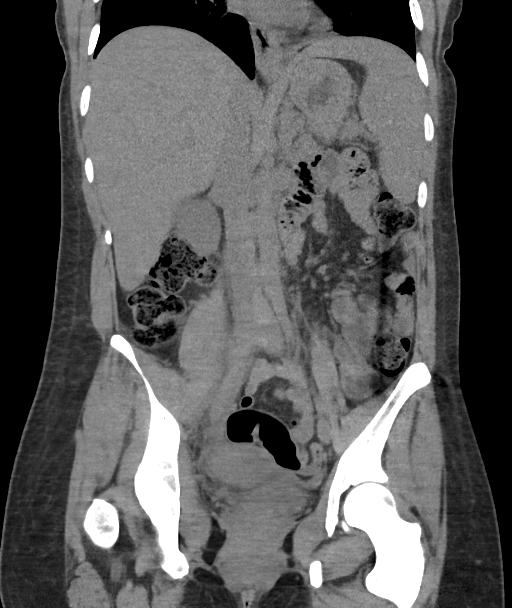

[16 of 46 positions shown; findings below may reference images not displayed]

FINDINGS: Lower chest: Subtotal collapse of the lower lobes bilaterally, left
greater than right. Cardiac size within normal limits. No
pericardial effusion.

Hepatobiliary: No focal liver abnormality is seen. No gallstones,
gallbladder wall thickening, or biliary dilatation.

Pancreas: Unremarkable

Spleen: Unremarkable

Adrenals/Urinary Tract: Adrenal glands are unremarkable. Kidneys are
normal, without renal calculi, focal lesion, or hydronephrosis.
Bladder is unremarkable.

Stomach/Bowel: There is moderate stool throughout the colon without
evidence of obstruction. The stomach, small bowel, and large bowel
are otherwise unremarkable. The appendix is normal. No free
intraperitoneal gas or fluid.

Vascular/Lymphatic: The abdominal vasculature is unremarkable on
this noncontrast examination. There is no pathologic adenopathy
within the abdomen and pelvis.

Reproductive: The uterus and left adnexa are unremarkable. There is
mild fullness of the right adnexa, not well characterized due to
adjacent right hemipelvic edema.

Other: There is extensive retroperitoneal edema anterior to the
right psoas and tracking into the right hemipelvis and into the
presacral space. The exact source of this inflammatory changes not
clearly evident on this examination though this does appear
intimately associated with the somewhat fall right adnexa.

Musculoskeletal: No acute bone abnormality. No lytic or blastic bone
lesion. In particular, intervertebral disc spaces and sacroiliac
joint spaces appear preserved.
IMPRESSION: Right retroperitoneal edema coursing anterior to the right iliopsoas
inferiorly, extending into the right pelvic sidewall and into the
presacral space. The source of this inflammatory changes not clearly
evident on this examination, however, this does appear to be
intimately related to fullness involving the right adnexa.
Considerations such as ovarian vein thrombosis or ovarian torsion
could be considered. Repeat imaging with contrast enhancement may be
helpful for further delineation and evaluation of the a right
ovarian vein.

Moderate stool burden without evidence of obstruction.

Bibasilar subtotal lower lobe collapse.
